# Patient Record
Sex: Male | Born: 1985 | Race: White | Hispanic: No | Marital: Married | Smoking: Never smoker
Health system: Southern US, Community
[De-identification: ages and names within clinical notes are randomized; demographics above are authoritative.]

## PROBLEM LIST (undated history)

## (undated) DIAGNOSIS — G935 Compression of brain: Secondary | ICD-10-CM

---
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
## 2006-07-18 ENCOUNTER — Ambulatory Visit: Payer: Self-pay | Admitting: Internal Medicine

## 2006-07-20 ENCOUNTER — Ambulatory Visit (HOSPITAL_COMMUNITY): Admission: RE | Admit: 2006-07-20 | Discharge: 2006-07-20 | Payer: Self-pay | Admitting: Internal Medicine

## 2006-09-10 ENCOUNTER — Emergency Department (HOSPITAL_COMMUNITY): Admission: EM | Admit: 2006-09-10 | Discharge: 2006-09-11 | Payer: Self-pay | Admitting: Internal Medicine

## 2007-02-19 ENCOUNTER — Emergency Department (HOSPITAL_COMMUNITY): Admission: EM | Admit: 2007-02-19 | Discharge: 2007-02-20 | Payer: Self-pay | Admitting: Emergency Medicine

## 2007-03-27 ENCOUNTER — Ambulatory Visit: Payer: Self-pay | Admitting: Family Medicine

## 2007-03-27 ENCOUNTER — Telehealth (INDEPENDENT_AMBULATORY_CARE_PROVIDER_SITE_OTHER): Payer: Self-pay | Admitting: *Deleted

## 2007-03-27 DIAGNOSIS — G935 Compression of brain: Secondary | ICD-10-CM

## 2007-03-30 ENCOUNTER — Telehealth (INDEPENDENT_AMBULATORY_CARE_PROVIDER_SITE_OTHER): Payer: Self-pay | Admitting: *Deleted

## 2007-05-03 ENCOUNTER — Encounter (INDEPENDENT_AMBULATORY_CARE_PROVIDER_SITE_OTHER): Payer: Self-pay | Admitting: Family Medicine

## 2007-05-08 ENCOUNTER — Encounter (INDEPENDENT_AMBULATORY_CARE_PROVIDER_SITE_OTHER): Payer: Self-pay | Admitting: Family Medicine

## 2007-05-18 ENCOUNTER — Telehealth (INDEPENDENT_AMBULATORY_CARE_PROVIDER_SITE_OTHER): Payer: Self-pay | Admitting: Family Medicine

## 2007-06-23 ENCOUNTER — Telehealth (INDEPENDENT_AMBULATORY_CARE_PROVIDER_SITE_OTHER): Payer: Self-pay | Admitting: Family Medicine

## 2007-08-29 ENCOUNTER — Encounter: Admission: RE | Admit: 2007-08-29 | Discharge: 2007-08-29 | Payer: Self-pay | Admitting: Neurosurgery

## 2007-09-01 ENCOUNTER — Encounter (INDEPENDENT_AMBULATORY_CARE_PROVIDER_SITE_OTHER): Payer: Self-pay | Admitting: Internal Medicine

## 2007-09-09 ENCOUNTER — Encounter (INDEPENDENT_AMBULATORY_CARE_PROVIDER_SITE_OTHER): Payer: Self-pay | Admitting: Family Medicine

## 2007-09-14 ENCOUNTER — Encounter (INDEPENDENT_AMBULATORY_CARE_PROVIDER_SITE_OTHER): Payer: Self-pay | Admitting: Internal Medicine

## 2007-12-04 ENCOUNTER — Emergency Department (HOSPITAL_COMMUNITY): Admission: EM | Admit: 2007-12-04 | Discharge: 2007-12-04 | Payer: Self-pay | Admitting: Family Medicine

## 2008-01-27 ENCOUNTER — Emergency Department (HOSPITAL_COMMUNITY): Admission: EM | Admit: 2008-01-27 | Discharge: 2008-01-28 | Payer: Self-pay | Admitting: Emergency Medicine

## 2008-06-15 ENCOUNTER — Emergency Department (HOSPITAL_COMMUNITY): Admission: EM | Admit: 2008-06-15 | Discharge: 2008-06-15 | Payer: Self-pay | Admitting: Emergency Medicine

## 2009-03-17 ENCOUNTER — Emergency Department (HOSPITAL_COMMUNITY): Admission: EM | Admit: 2009-03-17 | Discharge: 2009-03-17 | Payer: Self-pay | Admitting: Emergency Medicine
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
## 2009-08-20 ENCOUNTER — Emergency Department (HOSPITAL_COMMUNITY): Admission: EM | Admit: 2009-08-20 | Discharge: 2009-08-20 | Payer: Self-pay | Admitting: Emergency Medicine

## 2010-10-24 LAB — POCT I-STAT, CHEM 8
BUN: 23 mg/dL (ref 6–23)
Calcium, Ion: 1.24 mmol/L (ref 1.12–1.32)
Creatinine, Ser: 1.3 mg/dL (ref 0.4–1.5)
Glucose, Bld: 92 mg/dL (ref 70–99)
HCT: 45 % (ref 39.0–52.0)

## 2010-10-24 LAB — URINALYSIS, ROUTINE W REFLEX MICROSCOPIC
Hgb urine dipstick: NEGATIVE
Ketones, ur: NEGATIVE mg/dL
Protein, ur: NEGATIVE mg/dL
Urobilinogen, UA: 1 mg/dL (ref 0.0–1.0)
pH: 7.5 (ref 5.0–8.0)

## 2011-04-12 ENCOUNTER — Emergency Department (HOSPITAL_COMMUNITY)
Admission: EM | Admit: 2011-04-12 | Discharge: 2011-04-12 | Discharge status: Against Medical Advice | Payer: 59 | Attending: Emergency Medicine | Admitting: Emergency Medicine

## 2011-04-12 DIAGNOSIS — M25519 Pain in unspecified shoulder: Secondary | ICD-10-CM | POA: Insufficient documentation

## 2011-04-15 LAB — URINALYSIS, ROUTINE W REFLEX MICROSCOPIC
Bilirubin Urine: NEGATIVE
Glucose, UA: NEGATIVE
Hgb urine dipstick: NEGATIVE
Ketones, ur: NEGATIVE
Nitrite: NEGATIVE
Specific Gravity, Urine: 1.029
pH: 5.5

## 2015-02-21 ENCOUNTER — Other Ambulatory Visit: Payer: Self-pay | Admitting: Occupational Medicine

## 2015-02-21 ENCOUNTER — Ambulatory Visit
Admission: RE | Admit: 2015-02-21 | Discharge: 2015-02-21 | Disposition: A | Discharge status: Home / Self Care | Payer: No Typology Code available for payment source | Source: Ambulatory Visit | Attending: Occupational Medicine | Admitting: Occupational Medicine

## 2015-02-21 DIAGNOSIS — Z021 Encounter for pre-employment examination: Secondary | ICD-10-CM

## 2016-06-14 ENCOUNTER — Emergency Department (HOSPITAL_COMMUNITY): Payer: Worker's Compensation

## 2016-06-14 ENCOUNTER — Emergency Department (HOSPITAL_COMMUNITY)
Admission: EM | Admit: 2016-06-14 | Discharge: 2016-06-14 | Disposition: A | Discharge status: Home / Self Care | Payer: Worker's Compensation | Attending: Emergency Medicine | Admitting: Emergency Medicine

## 2016-06-14 ENCOUNTER — Encounter (HOSPITAL_COMMUNITY): Payer: Self-pay | Admitting: *Deleted

## 2016-06-14 DIAGNOSIS — Y939 Activity, unspecified: Secondary | ICD-10-CM | POA: Insufficient documentation

## 2016-06-14 DIAGNOSIS — S80211A Abrasion, right knee, initial encounter: Secondary | ICD-10-CM | POA: Insufficient documentation

## 2016-06-14 DIAGNOSIS — S60221A Contusion of right hand, initial encounter: Secondary | ICD-10-CM | POA: Diagnosis not present

## 2016-06-14 DIAGNOSIS — S0083XA Contusion of other part of head, initial encounter: Secondary | ICD-10-CM

## 2016-06-14 DIAGNOSIS — Y929 Unspecified place or not applicable: Secondary | ICD-10-CM | POA: Insufficient documentation

## 2016-06-14 DIAGNOSIS — Y999 Unspecified external cause status: Secondary | ICD-10-CM | POA: Insufficient documentation

## 2016-06-14 DIAGNOSIS — T148XXA Other injury of unspecified body region, initial encounter: Secondary | ICD-10-CM

## 2016-06-14 DIAGNOSIS — S0003XA Contusion of scalp, initial encounter: Secondary | ICD-10-CM | POA: Insufficient documentation

## 2016-06-14 DIAGNOSIS — S0990XA Unspecified injury of head, initial encounter: Secondary | ICD-10-CM | POA: Diagnosis present

## 2016-06-14 HISTORY — DX: Compression of brain: G93.5
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
## 2017-10-17 ENCOUNTER — Other Ambulatory Visit: Payer: Self-pay | Admitting: Nurse Practitioner

## 2017-10-17 ENCOUNTER — Ambulatory Visit
Admission: RE | Admit: 2017-10-17 | Discharge: 2017-10-17 | Disposition: A | Discharge status: Home / Self Care | Payer: No Typology Code available for payment source | Source: Ambulatory Visit | Attending: Nurse Practitioner | Admitting: Nurse Practitioner

## 2017-10-17 DIAGNOSIS — W19XXXA Unspecified fall, initial encounter: Secondary | ICD-10-CM

## 2020-05-26 ENCOUNTER — Other Ambulatory Visit: Payer: Self-pay

## 2020-05-26 DIAGNOSIS — Z20822 Contact with and (suspected) exposure to covid-19: Secondary | ICD-10-CM

## 2020-05-27 LAB — NOVEL CORONAVIRUS, NAA: SARS-CoV-2, NAA: NOT DETECTED

## 2020-05-27 LAB — SARS-COV-2, NAA 2 DAY TAT

## 2021-01-15 ENCOUNTER — Other Ambulatory Visit: Payer: Self-pay | Admitting: Orthopedic Surgery

## 2021-01-15 DIAGNOSIS — M4726 Other spondylosis with radiculopathy, lumbar region: Secondary | ICD-10-CM

## 2021-01-15 DIAGNOSIS — M5116 Intervertebral disc disorders with radiculopathy, lumbar region: Secondary | ICD-10-CM

## 2021-01-31 ENCOUNTER — Ambulatory Visit
Admission: RE | Admit: 2021-01-31 | Discharge: 2021-01-31 | Disposition: A | Discharge status: Home / Self Care | Payer: 59 | Source: Ambulatory Visit | Attending: Orthopedic Surgery | Admitting: Orthopedic Surgery

## 2021-01-31 ENCOUNTER — Other Ambulatory Visit: Payer: Self-pay

## 2021-01-31 DIAGNOSIS — M4726 Other spondylosis with radiculopathy, lumbar region: Secondary | ICD-10-CM

## 2021-01-31 DIAGNOSIS — M5116 Intervertebral disc disorders with radiculopathy, lumbar region: Secondary | ICD-10-CM

## 2022-08-11 IMAGING — MR MR LUMBAR SPINE W/O CM
4 of 5 series · 26 of 48 positions shown · non-contrast
Comparison: None available.

CLINICAL DATA: Initial evaluation for chronic low back pain with
left leg pain and numbness.

EXAM:
MRI LUMBAR SPINE WITHOUT CONTRAST
TECHNIQUE: Multiplanar, multisequence MR imaging of the lumbar spine was
performed. No intravenous contrast was administered.

[Series 3: T2 · sagittal · 4.0mm · 0.53mm/px · 6 of 15 slices shown (1 of 2)]
[im 1/15]
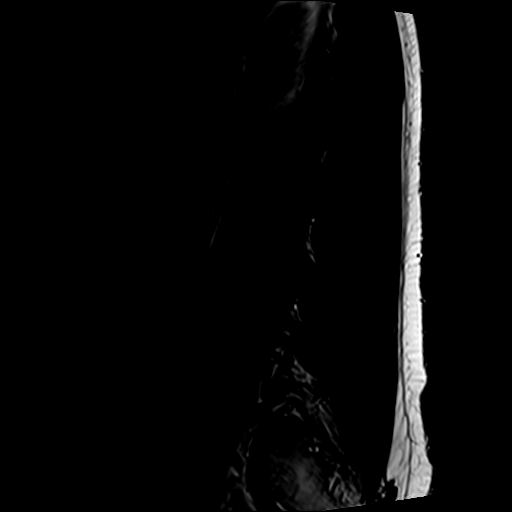
[im 3/15]
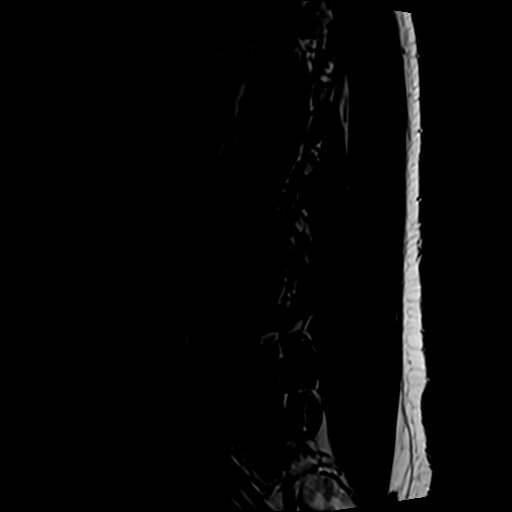
[im 6/15]
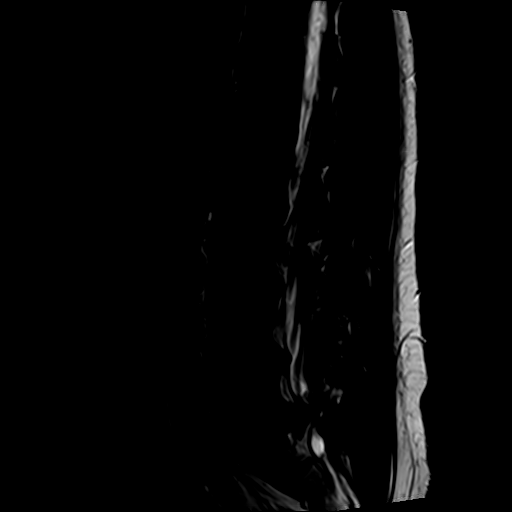
[im 9/15]
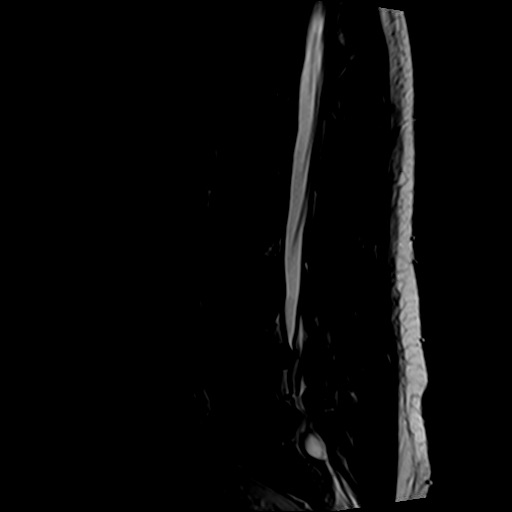
[im 12/15]
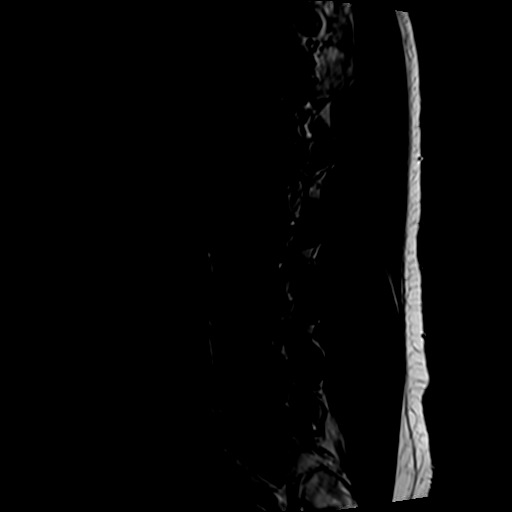
[im 15/15]
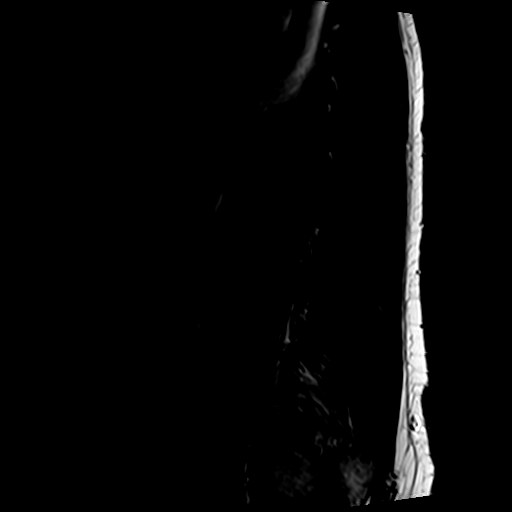

[Series 5: T1 · sagittal · 4.0mm · 0.53mm/px · 5 of 15 slices shown (1 of 2)]
[im 1/15]
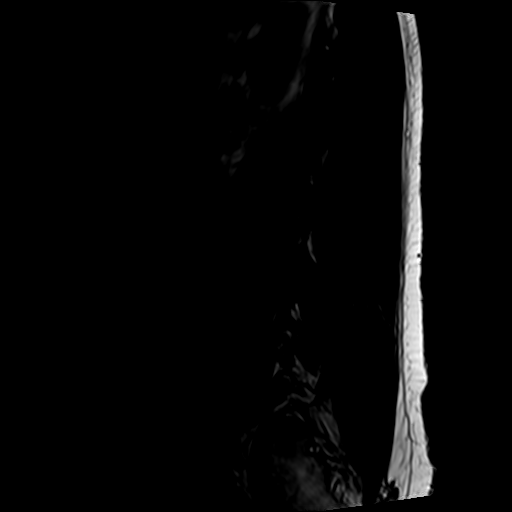
[im 4/15]
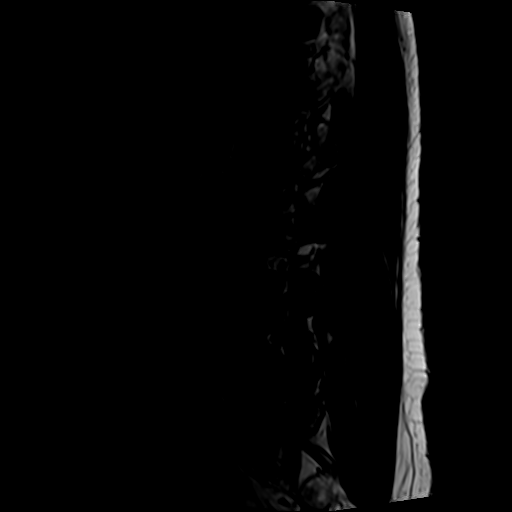
[im 8/15]
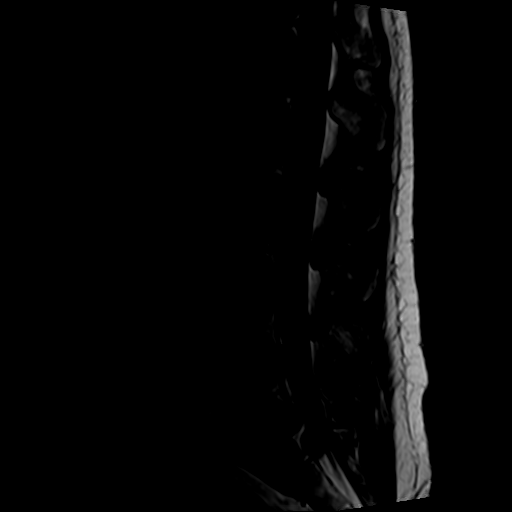
[im 11/15]
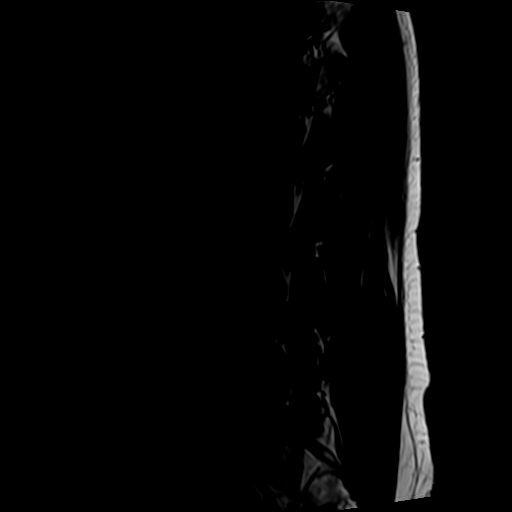
[im 15/15]
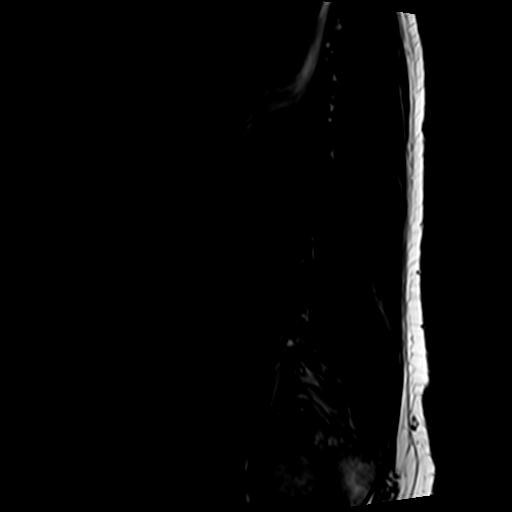

[Series 6: T2 · axial · 4.0mm · 0.70mm/px · z∈[-136,+95]mm · 10 of 43 slices shown (2 of 2)]
[im 3/43]
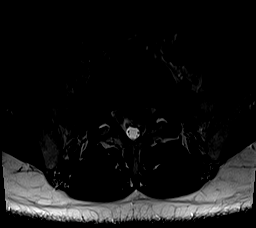
[im 6/43]
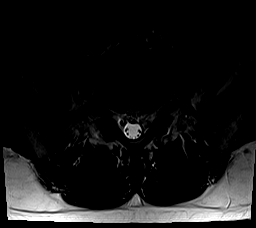
[im 9/43]
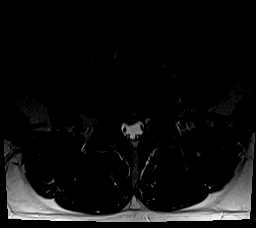
[im 15/43]
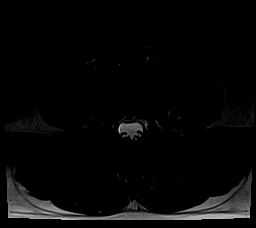
[im 20/43]
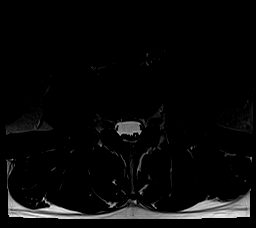
[im 23/43]
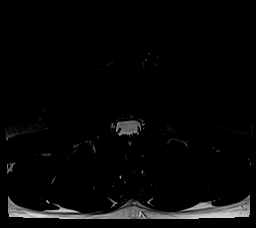
[im 26/43]
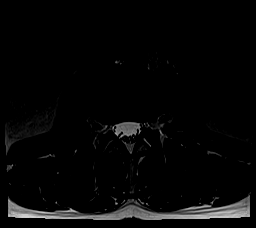
[im 31/43]
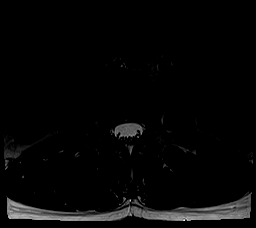
[im 37/43]
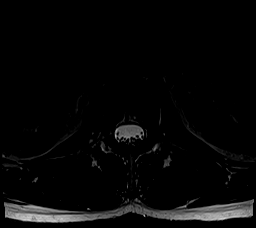
[im 43/43]
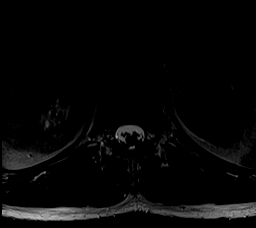

[Series 7: T1 · axial · 4.0mm · 0.35mm/px · z∈[-136,+64]mm · 5 of 43 slices shown (2 of 2)]
[im 3/43]
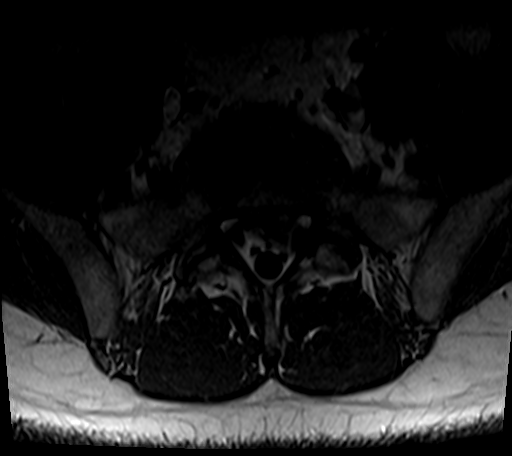
[im 6/43]
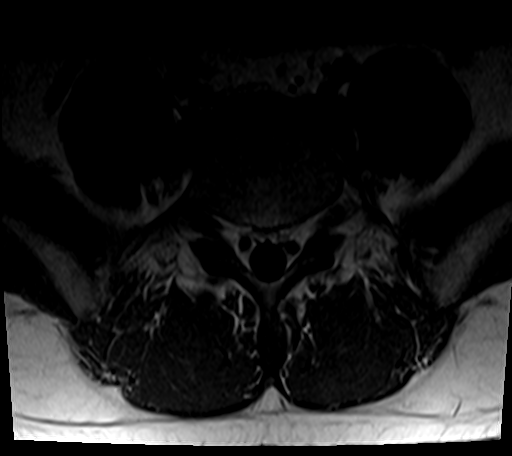
[im 9/43]
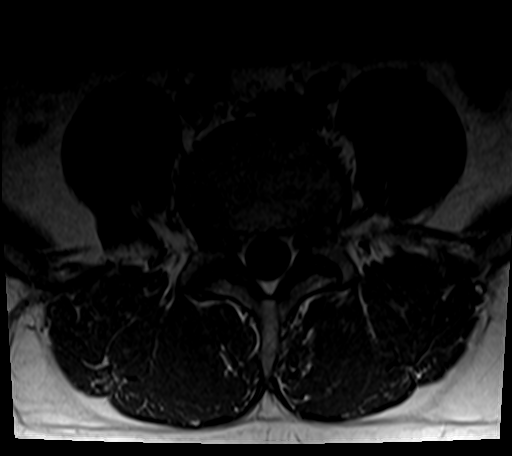
[im 23/43]
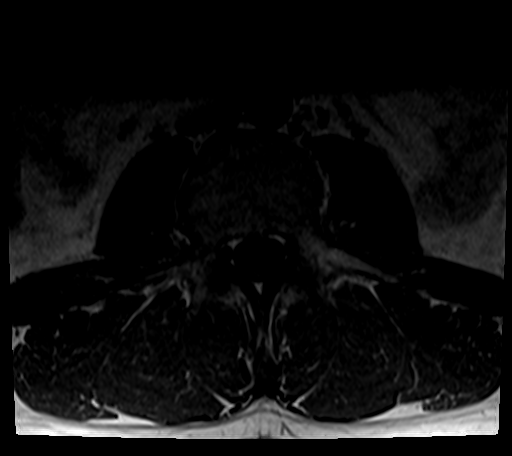
[im 37/43]
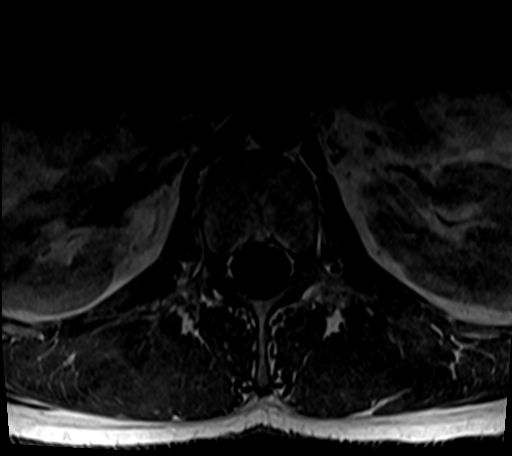

[26 of 48 positions shown; findings below may reference images not displayed]

FINDINGS: Segmentation: Standard. Lowest well-formed disc space labeled the
L5-S1 level.

Alignment: 5 mm retrolisthesis of L5 on S1. Straightening of the
normal lumbar lordosis.

Vertebrae: Vertebral body height maintained without acute or chronic
fracture. Bone marrow signal intensity within normal limits. No
discrete or worrisome osseous lesions. No abnormal marrow edema.

Conus medullaris and cauda equina: Conus extends to the T12 level.
Conus and cauda equina appear normal. Few small Tarlov cysts noted
posterior to the S1 segment.

Paraspinal and other soft tissues: Unremarkable.

Disc levels:

L1-2:  Unremarkable.

L2-3:  Unremarkable.

L3-4: Small left extraforaminal disc protrusion contacts the exiting
left L3 nerve root as it courses of the left neural foramen (series
7, image 26). Mild facet hypertrophy. No spinal stenosis. Foramina
remain patent.

L4-5: Degenerative intervertebral disc space narrowing with disc
desiccation and diffuse disc bulge. Superimposed small central to
right subarticular disc protrusion indents the right ventral thecal
sac (series 6, image 34). Associated annular fissure. Mild facet
hypertrophy. Resultant mild narrowing of the lateral recesses, right
greater than left. Central canal remains patent. Mild bilateral L4
foraminal stenosis.

L5-S1: Retrolisthesis. Degenerative intervertebral disc space
narrowing with diffuse disc bulge and disc desiccation. Associated
reactive endplate spurring. Moderate-sized left subarticular disc
protrusion extends into the left lateral recess, impinging upon the
descending left S1 nerve root (series 6, image 40). Mild facet
hypertrophy. Resultant severe left lateral recess stenosis. Central
canal remains patent. Mild to moderate bilateral L5 foraminal
stenosis.
IMPRESSION: 1. Moderate-sized left subarticular disc protrusion at L5-S1,
impinging upon the descending left S1 nerve root in the left lateral
recess.
2. Small left extraforaminal disc protrusion at L3-4, contacting and
potentially irritating the exiting left L3 nerve root.
3. Small central to right subarticular disc protrusion at L4-5 with
resultant mild bilateral lateral recess stenosis, right worse than
left.
